# Patient Record
Sex: Male | Born: 1960 | Hispanic: No | Marital: Married | Smoking: Never smoker
Health system: Southern US, Community
[De-identification: ages and names within clinical notes are randomized; demographics above are authoritative.]

## PROBLEM LIST (undated history)

## (undated) DIAGNOSIS — E785 Hyperlipidemia, unspecified: Secondary | ICD-10-CM

## (undated) DIAGNOSIS — T7840XA Allergy, unspecified, initial encounter: Secondary | ICD-10-CM

## (undated) DIAGNOSIS — R0789 Other chest pain: Secondary | ICD-10-CM

## (undated) HISTORY — DX: Other chest pain: R07.89

## (undated) HISTORY — DX: Hyperlipidemia, unspecified: E78.5

## (undated) HISTORY — DX: Allergy, unspecified, initial encounter: T78.40XA

## (undated) HISTORY — PX: OTHER SURGICAL HISTORY: SHX169

---

## 2004-01-23 ENCOUNTER — Ambulatory Visit: Payer: Self-pay | Admitting: Family Medicine

## 2004-04-05 ENCOUNTER — Ambulatory Visit: Payer: Self-pay | Admitting: Family Medicine

## 2004-05-05 ENCOUNTER — Ambulatory Visit: Payer: Self-pay | Admitting: Internal Medicine

## 2004-05-06 ENCOUNTER — Ambulatory Visit: Payer: Self-pay | Admitting: Internal Medicine

## 2004-05-14 ENCOUNTER — Ambulatory Visit: Payer: Self-pay

## 2004-10-19 ENCOUNTER — Ambulatory Visit: Payer: Self-pay | Admitting: Internal Medicine

## 2005-01-05 ENCOUNTER — Ambulatory Visit: Payer: Self-pay | Admitting: Internal Medicine

## 2005-04-18 ENCOUNTER — Ambulatory Visit: Payer: Self-pay | Admitting: Internal Medicine

## 2005-10-18 ENCOUNTER — Encounter (INDEPENDENT_AMBULATORY_CARE_PROVIDER_SITE_OTHER): Payer: Self-pay | Admitting: *Deleted

## 2005-10-18 ENCOUNTER — Ambulatory Visit: Payer: Self-pay | Admitting: Internal Medicine

## 2005-10-18 LAB — CONVERTED CEMR LAB: PSA: 1.84 ng/mL

## 2006-02-08 ENCOUNTER — Ambulatory Visit: Payer: Self-pay | Admitting: Internal Medicine

## 2006-04-12 ENCOUNTER — Ambulatory Visit: Payer: Self-pay | Admitting: Internal Medicine

## 2006-12-20 ENCOUNTER — Ambulatory Visit: Payer: Self-pay | Admitting: Family Medicine

## 2007-04-18 ENCOUNTER — Encounter (INDEPENDENT_AMBULATORY_CARE_PROVIDER_SITE_OTHER): Payer: Self-pay | Admitting: *Deleted

## 2007-04-18 DIAGNOSIS — M1990 Unspecified osteoarthritis, unspecified site: Secondary | ICD-10-CM | POA: Insufficient documentation

## 2007-04-18 DIAGNOSIS — E785 Hyperlipidemia, unspecified: Secondary | ICD-10-CM | POA: Insufficient documentation

## 2007-04-18 DIAGNOSIS — E786 Lipoprotein deficiency: Secondary | ICD-10-CM | POA: Insufficient documentation

## 2007-04-18 DIAGNOSIS — J301 Allergic rhinitis due to pollen: Secondary | ICD-10-CM

## 2007-11-29 ENCOUNTER — Ambulatory Visit: Payer: Self-pay | Admitting: Internal Medicine

## 2007-11-29 DIAGNOSIS — J309 Allergic rhinitis, unspecified: Secondary | ICD-10-CM | POA: Insufficient documentation

## 2007-12-14 ENCOUNTER — Encounter (INDEPENDENT_AMBULATORY_CARE_PROVIDER_SITE_OTHER): Payer: Self-pay | Admitting: *Deleted

## 2008-08-07 ENCOUNTER — Ambulatory Visit: Payer: Self-pay | Admitting: Internal Medicine

## 2008-08-07 LAB — CONVERTED CEMR LAB
Nitrite: NEGATIVE
Protein, U semiquant: NEGATIVE
Urobilinogen, UA: 0.2
WBC Urine, dipstick: NEGATIVE

## 2008-11-18 ENCOUNTER — Ambulatory Visit: Payer: Self-pay | Admitting: Internal Medicine

## 2008-11-28 ENCOUNTER — Ambulatory Visit: Payer: Self-pay | Admitting: Internal Medicine

## 2008-11-28 DIAGNOSIS — N400 Enlarged prostate without lower urinary tract symptoms: Secondary | ICD-10-CM

## 2010-04-04 LAB — CONVERTED CEMR LAB
AST: 20 units/L (ref 0–37)
Albumin: 4.2 g/dL (ref 3.5–5.2)
Alkaline Phosphatase: 37 units/L — ABNORMAL LOW (ref 39–117)
BUN: 10 mg/dL (ref 6–23)
Blood in Urine, dipstick: NEGATIVE
Chloride: 104 meq/L (ref 96–112)
Eosinophils Relative: 3.9 % (ref 0.0–5.0)
GFR calc Af Amer: 133 mL/min
Glucose, Bld: 74 mg/dL (ref 70–99)
HCT: 44.5 % (ref 39.0–52.0)
HDL: 34.2 mg/dL — ABNORMAL LOW (ref >39.0)
Monocytes Absolute: 0.6 10*3/uL (ref 0.1–1.0)
Monocytes Relative: 9 % (ref 3.0–12.0)
Nitrite: NEGATIVE
Platelets: 231 10*3/uL (ref 150–400)
Potassium: 3.8 meq/L (ref 3.5–5.1)
RBC: 4.99 M/uL (ref 4.22–5.81)
Specific Gravity, Urine: 1.02
Total CHOL/HDL Ratio: 4.8
Total Protein: 8.1 g/dL (ref 6.0–8.3)
WBC Urine, dipstick: NEGATIVE
WBC: 7.2 10*3/uL (ref 4.5–10.5)

## 2010-05-01 ENCOUNTER — Encounter: Payer: Self-pay | Admitting: Internal Medicine

## 2010-06-14 ENCOUNTER — Ambulatory Visit (INDEPENDENT_AMBULATORY_CARE_PROVIDER_SITE_OTHER): Payer: 59 | Admitting: Internal Medicine

## 2010-06-14 ENCOUNTER — Encounter: Payer: Self-pay | Admitting: Internal Medicine

## 2010-06-14 VITALS — BP 120/84 | HR 74 | Ht 69.75 in | Wt 153.0 lb

## 2010-06-14 DIAGNOSIS — R6882 Decreased libido: Secondary | ICD-10-CM

## 2010-06-14 DIAGNOSIS — E785 Hyperlipidemia, unspecified: Secondary | ICD-10-CM

## 2010-06-14 DIAGNOSIS — Z0000 Encounter for general adult medical examination without abnormal findings: Secondary | ICD-10-CM

## 2010-06-14 DIAGNOSIS — N400 Enlarged prostate without lower urinary tract symptoms: Secondary | ICD-10-CM

## 2010-06-14 DIAGNOSIS — N529 Male erectile dysfunction, unspecified: Secondary | ICD-10-CM

## 2010-06-14 DIAGNOSIS — J309 Allergic rhinitis, unspecified: Secondary | ICD-10-CM

## 2010-06-14 MED ORDER — FLUTICASONE PROPIONATE 50 MCG/ACT NA SUSP: 1.0000 | Freq: Two times a day (BID) | NASAL | Status: AC | PRN

## 2010-06-14 MED ORDER — TADALAFIL 5 MG PO TABS: 5.0000 mg | ORAL_TABLET | Freq: Every day | ORAL | Status: DC

## 2010-06-14 NOTE — Progress Notes (Signed)
  Subjective:    Patient ID: Stephen Webb, male    DOB: 08-23-1960, 50 y.o.   MRN: [id number]  HPI He is here for a physical; he is asymptomatic except for some ED & decreased libido. He exercises 20 min 3X/ week as CVE & strength  w/o symptoms . He never filled Pravastatin as he was concerned about possible adverse effects.   Review of Systems Patient reports no  vision/ hearing changes,anorexia, weight change, fever ,adenopathy, persistant / recurrent hoarseness, swallowing issues, chest pain,palpitations, edema,persistant / recurrent cough, hemoptysis, dyspnea(rest, exertional, paroxysmal nocturnal), gastrointestinal  bleeding (melena, rectal bleeding), abdominal pain, excessive heart burn, GU symptoms( dysuria, hematuria, pyuria, incontinence  issues) syncope, focal weakness, memory loss,numbness & tingling, skin/hair/nail changes,depression, anxiety, abnormal bruising/bleeding, musculoskeletal symptoms/signs. He questions intermittent incomplete voiding.     Objective:   Physical Exam  Gen.: Thin but healthy and well-nourished in appearance. Alert, appropriate and cooperative throughout exam. Head: Normocephalic without obvious abnormalities;no alopecia. Eyes: No corneal or conjunctival inflammation noted. Pupils equal round reactive to light and accommodation. Fundal exam is benign without hemorrhages, exudate, papilledema. Extraocular motion intact. Vision grossly normal. Ears: External  ear exam reveals no significant lesions or deformities. Canals  Clear &.TMs normal. Hearing is grossly normal bilaterally. Nose: External nasal exam reveals no deformity or inflammation. Nasal mucosa are pink and moist but edema R nare  w/o exudates noted. Septum  normal.  Mouth: Oral mucosa and oropharynx reveal no lesions or exudates. Teeth in good repair. Neck: No deformities, masses, or tenderness noted. Range of motion normal. Thyroid normal. Lungs: Normal respiratory effort; chest expands  symmetrically. Lungs are clear to auscultation without rales, wheezes, or increased work of breathing. Heart: Normal rate and rhythm. Normal S1 and S2. No gallop, click, or rub. No  murmur. Abdomen: Bowel sounds normal; abdomen soft and nontender. No masses, organomegaly or hernias noted. Genitalia: Scrotal and penile  exam normal . DRE  Soft prostate , ULN w/o nodules . Musculoskeletal/extremities: No deformity or scoliosis noted of  the thoracic or lumbar spine. No clubbing, cyanosis, edema, or deformity noted. Range of motion normal .Tone & strength  Normal..Joints  w/o deformity.. Nail health  good. Vascular: Carotid, radial artery, dorsalis pedis and dorsalis posterior tibial pulses are full and equal. No bruits present. Neurologic: Alert and oriented x3. Deep tendon reflexes symmetrical and normal.  Skin: Intact without suspicious lesions or rashes. Lymph: No cervical, axillary, or inguinal lymphadenopathy present. Psych: Mood and affect are normal; no evidence of anxiety or depression.            Assessment & Plan:  #1 Wellness exam   #2 ? ED   #3 decreased libido #4 extrinsic allergies  #5 dyslipidemia treated with TLC  Plan:#1 Trial of low dose daily Cialis  #2 generic Flonase

## 2010-06-14 NOTE — Patient Instructions (Signed)
Please schedule fasting am labs: [facility]), hepatic panel, CBC & dif , BMET, TSH, PSA, Testosterone level (total). Codes : V70.0, 272.4,600.00,607.84.                                                              Preventive Health Care: Exercise at least 30-45 minutes a day,  3-4 days a week.  Eat a low-fat diet with lots of fruits and vegetables, up to 7-9 servings per day. Avoid obesity; your goal is waist measurement < 40 inches.Consume less than 40 grams of sugar per day from foods & drinks with High Fructose Corn Sugar as #2,3 or # 4 on label. Eye Doctor - have an eye exam @ least annually.                                                                                 Health Care Power of Attorney & Living Will. Complete if not in place ; these place you in charge of your health care decisions.

## 2010-06-15 ENCOUNTER — Other Ambulatory Visit (INDEPENDENT_AMBULATORY_CARE_PROVIDER_SITE_OTHER): Payer: 59

## 2010-06-15 DIAGNOSIS — N529 Male erectile dysfunction, unspecified: Secondary | ICD-10-CM

## 2010-06-15 DIAGNOSIS — Z0000 Encounter for general adult medical examination without abnormal findings: Secondary | ICD-10-CM

## 2010-06-15 DIAGNOSIS — E785 Hyperlipidemia, unspecified: Secondary | ICD-10-CM

## 2010-06-15 DIAGNOSIS — N400 Enlarged prostate without lower urinary tract symptoms: Secondary | ICD-10-CM

## 2010-06-15 LAB — HEPATIC FUNCTION PANEL
ALT: 17 U/L (ref 0–53)
Albumin: 4.1 g/dL (ref 3.5–5.2)
Alkaline Phosphatase: 45 U/L (ref 39–117)
Bilirubin, Direct: 0.1 mg/dL (ref 0.0–0.3)
Total Protein: 7.5 g/dL (ref 6.0–8.3)

## 2010-06-15 LAB — CBC WITH DIFFERENTIAL/PLATELET
Basophils Absolute: 0 10*3/uL (ref 0.0–0.1)
Eosinophils Absolute: 0.3 10*3/uL (ref 0.0–0.7)
Hemoglobin: 15.4 g/dL (ref 13.0–17.0)
Lymphocytes Relative: 41.9 % (ref 12.0–46.0)
MCHC: 34.6 g/dL (ref 30.0–36.0)
Monocytes Relative: 9.3 % (ref 3.0–12.0)
Neutro Abs: 1.6 10*3/uL (ref 1.4–7.7)
Neutrophils Relative %: 41 % — ABNORMAL LOW (ref 43.0–77.0)
Platelets: 217 10*3/uL (ref 150.0–400.0)
RDW: 12.8 % (ref 11.5–14.6)

## 2010-06-15 LAB — BASIC METABOLIC PANEL
CO2: 29 mEq/L (ref 19–32)
Chloride: 104 mEq/L (ref 96–112)
Creatinine, Ser: 1 mg/dL (ref 0.4–1.5)
Potassium: 4.4 mEq/L (ref 3.5–5.1)
Sodium: 140 mEq/L (ref 135–145)

## 2010-06-15 LAB — TESTOSTERONE: Testosterone: 651.88 ng/dL (ref 350.00–890.00)

## 2010-06-15 LAB — TSH: TSH: 1.35 u[IU]/mL (ref 0.35–5.50)

## 2010-06-15 LAB — PSA: PSA: 2.11 ng/mL (ref 0.10–4.00)

## 2010-07-01 ENCOUNTER — Encounter: Payer: Self-pay | Admitting: Internal Medicine

## 2010-11-10 ENCOUNTER — Ambulatory Visit (INDEPENDENT_AMBULATORY_CARE_PROVIDER_SITE_OTHER): Payer: 59 | Admitting: Internal Medicine

## 2010-11-10 ENCOUNTER — Encounter: Payer: Self-pay | Admitting: Internal Medicine

## 2010-11-10 ENCOUNTER — Ambulatory Visit (HOSPITAL_BASED_OUTPATIENT_CLINIC_OR_DEPARTMENT_OTHER)
Admission: RE | Admit: 2010-11-10 | Discharge: 2010-11-10 | Disposition: A | Discharge status: Home / Self Care | Payer: 59 | Source: Ambulatory Visit | Attending: Internal Medicine | Admitting: Internal Medicine

## 2010-11-10 DIAGNOSIS — R7611 Nonspecific reaction to tuberculin skin test without active tuberculosis: Secondary | ICD-10-CM

## 2010-11-10 DIAGNOSIS — E785 Hyperlipidemia, unspecified: Secondary | ICD-10-CM

## 2010-11-10 NOTE — Patient Instructions (Signed)
Order for x-rays entered into  the computer; these will be performed at  [facility]. No appointment is necessary.

## 2010-11-10 NOTE — Progress Notes (Signed)
  Subjective:    Patient ID: Stephen Webb, male    DOB: 1960-12-27, 50 y.o.   MRN: [id number]  HPIThe [place]  consulate has requested clearance for travel to [place]; proof of absence of communicable disease has been requested.  He had recent weight loss but this was related to fasting during Ramadan  He specifically denies fever, chills, sweats, chest pain, palpitations, cough, hemoptysis or shortness of breath . He also denies abdominal pain, diarrhea, tremor, dysuria, pyuria, hematuria, or new rash or lesions.  Past history includes positive PPD. He is unsure as to whether he has ever received BCG immunization, which might cause a false positive PPD. Last chest x-ray was approximately 13 years ago.     Review of Systems     Objective:   Physical Exam  Gen.: Thin but well-nourished; in no acute distress Eyes: Extraocular motion intact; no lid lag or proptosis Heart: Normal rhythm and rate without significant murmur, gallop, or extra heart sounds Lungs: Chest clear to auscultation without rales,rales, wheezes Abdomen: bowel sounds normal, soft and non-tender without masses, organomegaly or hernias noted.  No guarding or rebound  Neuro:Deep tendon reflexes are equal and within normal limits; no tremor  Skin: Warm and dry without significant lesions or rashes; no onycholysis Lymphatic: No lymphadenopathy is noted about the head, neck, axilla Psych: Normally communicative and interactive; no abnormal mood or affect clinically.         Assessment & Plan:   #1 no evidence of communicable disease  #2 past history of positive PPD  Plan: Chest x-ray will be repeated; this would prevent any delay in travel if  it reveals no active disease.

## 2010-11-16 ENCOUNTER — Telehealth: Payer: Self-pay | Admitting: Internal Medicine

## 2010-11-16 NOTE — Telephone Encounter (Signed)
Spoke with patient, patient requested copy of Chest Xray (already mailed), patient also request a copy to be faxed to [phone] (faxed) . Xray and OV note indicated patient with no communicable disease

## 2010-11-25 ENCOUNTER — Ambulatory Visit (INDEPENDENT_AMBULATORY_CARE_PROVIDER_SITE_OTHER): Payer: 59 | Admitting: *Deleted

## 2010-11-25 VITALS — Temp 98.0°F

## 2010-11-25 DIAGNOSIS — Z0000 Encounter for general adult medical examination without abnormal findings: Secondary | ICD-10-CM

## 2011-05-09 ENCOUNTER — Encounter: Payer: Self-pay | Admitting: Family Medicine

## 2011-05-09 ENCOUNTER — Ambulatory Visit (INDEPENDENT_AMBULATORY_CARE_PROVIDER_SITE_OTHER): Payer: 59 | Admitting: Family Medicine

## 2011-05-09 DIAGNOSIS — H109 Unspecified conjunctivitis: Secondary | ICD-10-CM

## 2011-05-09 DIAGNOSIS — J301 Allergic rhinitis due to pollen: Secondary | ICD-10-CM

## 2011-05-09 DIAGNOSIS — J019 Acute sinusitis, unspecified: Secondary | ICD-10-CM

## 2011-05-09 MED ORDER — AMOXICILLIN 875 MG PO TABS: 875.0000 mg | ORAL_TABLET | Freq: Two times a day (BID) | ORAL | Status: AC

## 2011-05-09 NOTE — Progress Notes (Signed)
  Subjective:    Patient ID: Stephen Webb, male    DOB: [birth year], 51 y.o.   MRN: [id number]  HPI URI- sxs started last week but worsened over the weekend.  + nasal congestion, PND, sore throat.  + facial pain yesterday.  Denies cough.  Woke up this AM w/ red eyes, sticky drainage from both eyes.  No itching of eyes, no pain, no visual changes.  No fevers.  + sick contacts.  Hx of seasonal allergies- taking OTC antihistamine but not consistently.   Review of Systems For ROS see HPI     Objective:   Physical Exam  Constitutional: He appears well-developed and well-nourished. No distress.  HENT:  Head: Normocephalic and atraumatic.  Right Ear: Tympanic membrane normal.  Left Ear: Tympanic membrane normal.  Nose: Mucosal edema and rhinorrhea present. Right sinus exhibits maxillary sinus tenderness and frontal sinus tenderness. Left sinus exhibits maxillary sinus tenderness and frontal sinus tenderness.  Mouth/Throat: Mucous membranes are normal. Oropharyngeal exudate and posterior oropharyngeal erythema present. No posterior oropharyngeal edema.       + PND  Eyes: EOM are normal. Pupils are equal, round, and reactive to light.       Conjunctival erythema/injxn- no drainage.  Neck: Normal range of motion. Neck supple.  Cardiovascular: Normal rate, regular rhythm and normal heart sounds.   Pulmonary/Chest: Effort normal and breath sounds normal. No respiratory distress. He has no wheezes.       + hacking cough  Lymphadenopathy:    He has no cervical adenopathy.  Skin: Skin is warm and dry.          Assessment & Plan:

## 2011-05-09 NOTE — Patient Instructions (Signed)
This is an early sinus infection Start the Amoxicillin twice daily (this will also treat your eyes) Drink plenty of fluids Take the Claritin or Zyrtec daily to improve nasal congestion, post nasal drip, and eye irritation Call with any questions or concerns Hang in there!

## 2011-05-10 DIAGNOSIS — H109 Unspecified conjunctivitis: Secondary | ICD-10-CM | POA: Insufficient documentation

## 2011-05-10 NOTE — Assessment & Plan Note (Signed)
New.  Pt's sxs more consistent w/ allergic conjunctivitis but if there is a component of bacterial infxn it will be tx'd w/ the systemic abx for his sinusitis.  Reviewed supportive care and red flags that should prompt return.  Pt expressed understanding and is in agreement w/ plan.

## 2011-05-10 NOTE — Assessment & Plan Note (Signed)
Deteriorated.  Encouraged pt to start oral antihistamine daily rather than sporadically.  Pt in agreement.

## 2011-05-10 NOTE — Assessment & Plan Note (Signed)
New.  Pt's sxs and PE consistent w/ infxn.  Start abx.  Reviewed supportive care and red flags that should prompt return.  Pt expressed understanding and is in agreement w/ plan.  

## 2011-07-12 ENCOUNTER — Other Ambulatory Visit: Payer: Self-pay | Admitting: Internal Medicine

## 2011-07-12 NOTE — Telephone Encounter (Signed)
Patient needs to schedule a CPX  

## 2011-11-14 ENCOUNTER — Encounter: Payer: Self-pay | Admitting: Internal Medicine

## 2011-11-14 ENCOUNTER — Ambulatory Visit (INDEPENDENT_AMBULATORY_CARE_PROVIDER_SITE_OTHER): Payer: 59 | Admitting: Internal Medicine

## 2011-11-14 VITALS — BP 114/78 | HR 77 | Temp 98.3°F | Resp 12 | Ht 71.5 in | Wt 147.2 lb

## 2011-11-14 DIAGNOSIS — Z0000 Encounter for general adult medical examination without abnormal findings: Secondary | ICD-10-CM

## 2011-11-14 DIAGNOSIS — E785 Hyperlipidemia, unspecified: Secondary | ICD-10-CM

## 2011-11-14 MED ORDER — TADALAFIL 5 MG PO TABS: 5.0000 mg | ORAL_TABLET | Freq: Every day | ORAL | Status: DC

## 2011-11-14 NOTE — Addendum Note (Signed)
Addended by: Maurice Small on: 11/14/2011 02:33 PM   Modules accepted: Orders

## 2011-11-14 NOTE — Progress Notes (Signed)
  Subjective:    Patient ID: Stephen Webb, male    DOB: October 12, 1960, 51 y.o.   MRN: [id number]  HPI  Stephen Webb  is here for a physical; he denies acute issues.     Review of Systems He exercises as walking for 15-20 minutes every other day without symptoms. He specifically denies chest pain, palpitations, claudication, dyspnea, or edema. He is on low sugar diet. He elected not to take a statin as he was concerned about the potential side effects. The 2007 advanced cholesterol test was reviewed; LDL goal is less than 100. His seasonal allergies have been much better this year; these are typically in the Spring and to lesser extent in the Fall. He is 3; he has not had a colonoscopy. Standard of care was reviewed. His weight has been essentially stable even while fasting during Ramadan. He denies abdominal pain, melena, rectal bleeding, or unexplained weight loss.     Objective:   Physical Exam Gen.: Thin but healthy and well-nourished in appearance. Alert, appropriate and cooperative throughout exam. Head: Normocephalic without obvious abnormalities; no alopecia. Goatee Eyes: No corneal or conjunctival inflammation noted. Pupils equal round reactive to light and accommodation. Fundal exam is benign without hemorrhages, exudate, papilledema. Extraocular motion intact. Vision grossly normal with lenses. Ears: External  ear exam reveals no significant lesions or deformities. Wax on R; LTM normal. Hearing is grossly normal bilaterally. Nose: External nasal exam reveals no deformity or inflammation. Nasal mucosa are pink and moist. No lesions or exudates noted.   Mouth: Oral mucosa and oropharynx reveal no lesions or exudates. Teeth in good repair. Neck: No deformities, masses, or tenderness noted. Range of motion & Thyroid normal Lungs: Normal respiratory effort; chest expands symmetrically. Lungs are clear to auscultation without rales, wheezes, or increased work of breathing. Heart: Normal rate  and rhythm. Normal S1 and S2. No gallop, click, or rub. S4 w/o murmur. Abdomen: Bowel sounds normal; abdomen soft and nontender. No masses, organomegaly or hernias noted. Genitalia/DRE:Genitalia normal except for left varices . Prostate is enlarged 1.5-2.0+ without nodularity or induration. Musculoskeletal/extremities: No deformity or scoliosis noted of  the thoracic or lumbar spine. No clubbing, cyanosis, edema, or deformity noted. Range of motion  normal .Tone & strength  normal.Joints normal. Nail health  good. Vascular: Carotid, radial artery, dorsalis pedis and  posterior tibial pulses are full and equal. No bruits present. Neurologic: Alert and oriented x3. Deep tendon reflexes symmetrical and normal.         Skin: Intact without suspicious lesions or rashes. Lymph: No cervical, axillary, or inguinal lymphadenopathy present. Psych: Mood and affect are normal. Normally interactive                                                                                         Assessment & Plan:  #1 comprehensive physical exam; no acute findings #2 see Problem List with Assessments & Recommendations Plan: see Orders

## 2011-11-14 NOTE — Patient Instructions (Addendum)
Preventive Health Care: Exercise at least 30-45 minutes a day,  3-4 days a week.  Eat a low-fat diet with lots of fruits and vegetables, up to 7-9 servings per day.  Consume less than 40 grams of sugar per day from foods & drinks with High Fructose Corn Sugar as # 1,2,3 or # 4 on label. Health Care Power of Attorney & Living Will. Complete if not in place ; these place you in charge of your health care decisions. As per the Standard of Care , screening Colonoscopy recommended @ 50 & every 5-10 years thereafter . More frequent monitor would be dictated by family history or findings @ Colonoscopy  Please  schedule fasting Labs : BMET,Lipids, hepatic panel, CBC & dif, TSH, PSA. PLEASE BRING THESE INSTRUCTIONS TO FOLLOW UP  LAB APPOINTMENT.This will guarantee correct labs are drawn, eliminating need for repeat blood sampling ( needle sticks ! ). Diagnoses /Codes: V70.0.  If you activate My Chart; the results can be released to you as soon as they populate from the lab. If you choose not to use this program; the labs have to be reviewed, copied & mailed   causing a delay in getting the results to you.

## 2011-11-16 ENCOUNTER — Other Ambulatory Visit: Payer: 59

## 2011-11-17 ENCOUNTER — Other Ambulatory Visit (INDEPENDENT_AMBULATORY_CARE_PROVIDER_SITE_OTHER): Payer: 59

## 2011-11-17 DIAGNOSIS — Z0000 Encounter for general adult medical examination without abnormal findings: Secondary | ICD-10-CM

## 2011-11-17 LAB — BASIC METABOLIC PANEL
BUN: 12 mg/dL (ref 6–23)
CO2: 24 mEq/L (ref 19–32)
Calcium: 9.4 mg/dL (ref 8.4–10.5)
Glucose, Bld: 87 mg/dL (ref 70–99)
Sodium: 141 mEq/L (ref 135–145)

## 2011-11-17 LAB — CBC WITH DIFFERENTIAL/PLATELET
Basophils Relative: 0.8 % (ref 0.0–3.0)
Eosinophils Absolute: 0.2 10*3/uL (ref 0.0–0.7)
Eosinophils Relative: 4 % (ref 0.0–5.0)
HCT: 41.5 % (ref 39.0–52.0)
Lymphs Abs: 2.6 10*3/uL (ref 0.7–4.0)
MCHC: 33.3 g/dL (ref 30.0–36.0)
MCV: 90 fl (ref 78.0–100.0)
Monocytes Absolute: 0.4 10*3/uL (ref 0.1–1.0)
Neutrophils Relative %: 29.4 % — ABNORMAL LOW (ref 43.0–77.0)
Platelets: 189 10*3/uL (ref 150.0–400.0)

## 2011-11-17 LAB — HEPATIC FUNCTION PANEL
Bilirubin, Direct: 0.1 mg/dL (ref 0.0–0.3)
Total Bilirubin: 0.6 mg/dL (ref 0.3–1.2)

## 2011-11-17 LAB — LIPID PANEL
HDL: 44.9 mg/dL (ref >39.00)
Total CHOL/HDL Ratio: 4
VLDL: 16.8 mg/dL (ref 0.0–40.0)

## 2011-11-17 LAB — PSA: PSA: 1.31 ng/mL (ref 0.10–4.00)

## 2011-11-17 NOTE — Progress Notes (Signed)
Labs only

## 2012-05-02 ENCOUNTER — Ambulatory Visit (INDEPENDENT_AMBULATORY_CARE_PROVIDER_SITE_OTHER): Payer: 59 | Admitting: Internal Medicine

## 2012-05-02 ENCOUNTER — Encounter: Payer: Self-pay | Admitting: Internal Medicine

## 2012-05-02 VITALS — BP 120/84 | HR 78 | Temp 97.9°F | Wt 149.0 lb

## 2012-05-02 DIAGNOSIS — H019 Unspecified inflammation of eyelid: Secondary | ICD-10-CM

## 2012-05-02 MED ORDER — GENTAMICIN SULFATE 0.3 % OP SOLN: 2.0000 [drp] | OPHTHALMIC | Status: DC

## 2012-05-02 NOTE — Assessment & Plan Note (Signed)
Infected eye lid gland, will prescribe gentamicin, will refer to ophthalmology within 48 hours. See instructions

## 2012-05-02 NOTE — Patient Instructions (Addendum)
Use eyedrops as recommended. Put a warm compress over the right eye 3 times a day. Go to the ER if you have any swelling in the face, decreased vision.

## 2012-05-02 NOTE — Progress Notes (Signed)
  Subjective:    Patient ID: Stephen Webb, male    DOB: Feb 14, 1961, 52 y.o.   MRN: [id number]  HPI Acute visit Noted a lump in the right eye last week ago, getting slightly bigger. Has a small amount of dry discharge in the corner of the right eye for the last few days.  Past Medical History  Diagnosis Date  . Atypical chest pain     Stress Cardiolite Negative 2006   . Allergy     seasonal, especially in Spring  . Hyperlipidemia     Pravastatin never taken ; he was concerned about  adverse effects   Past Surgical History  Procedure Laterality Date  . None    . No colonoscopy      SOC discussed      Review of Systems Denies any actual pain or blurred vision but his eyes are itching.    Objective:   Physical Exam  Constitutional: He appears well-developed and well-nourished. No distress.  Eyes: EOM are normal. Pupils are equal, round, and reactive to light. Right eye exhibits no discharge. Left eye exhibits no discharge. No scleral icterus.    Skin: He is not diaphoretic.       Assessment & Plan:

## 2012-09-03 HISTORY — PX: RETINAL LASER PROCEDURE: SHX2339

## 2012-10-24 ENCOUNTER — Ambulatory Visit (INDEPENDENT_AMBULATORY_CARE_PROVIDER_SITE_OTHER): Payer: 59 | Admitting: Internal Medicine

## 2012-10-24 ENCOUNTER — Encounter: Payer: Self-pay | Admitting: Internal Medicine

## 2012-10-24 VITALS — BP 118/80 | HR 77 | Temp 98.5°F | Wt 144.8 lb

## 2012-10-24 DIAGNOSIS — J209 Acute bronchitis, unspecified: Secondary | ICD-10-CM

## 2012-10-24 MED ORDER — AZITHROMYCIN 250 MG PO TABS: ORAL_TABLET | ORAL | Status: DC

## 2012-10-24 NOTE — Patient Instructions (Addendum)
Plain Mucinex (NOT D) for thick secretions ;force NON dairy fluids .  Mucinex DM as needed for cough. Nasal cleansing in the shower as discussed with lather of mild shampoo.After 10 seconds wash off lather while  exhaling through nostrils. Make sure that all residual soap is removed to prevent irritation.  Use a Neti pot daily only  as needed for significant sinus congestion; going from open side to congested side . Plain Allegra (NOT D )  160 daily , Loratidine 10 mg , OR Zyrtec 10 mg @ bedtime  as needed for itchy eyes & sneezing.

## 2012-10-24 NOTE — Progress Notes (Signed)
  Subjective:    Patient ID: Stephen Webb, male    DOB: [birth year], 52 y.o.   MRN: [id number]  HPI    2 weeks ago he had a sore throat and was treated with antibiotics apparently ( ? Amoxicillin 200 mg) while in [place].  On 10/21/12 he began to have nasal congestion and rhinitis with sneezing. Subsequently developed chest congestion with dark yellow- green  sputum. There is no associated shortness of breath or wheezing.  He describes minor  frontal sinus discomfort as well as some nasal purulence.  He also describes some arthralgias and shoulder area    Review of Systems  He denies fever, chills, or sweats. He has no significant extrinsic symptoms of itchy, watery eyes.  He is not having facial pain, dental pain, otic pain, or otic discharge.     Objective:   Physical Exam General appearance: thin but in good health ;well nourished; no acute distress or increased work of breathing is present.  No  lymphadenopathy about the head, neck, or axilla noted.   Eyes: No conjunctival inflammation or lid edema is present.   Ears:  External ear exam shows no significant lesions or deformities.  Otoscopic examination reveals clear canals, tympanic membranes are intact bilaterally without bulging, retraction, inflammation or discharge.  Nose:  External nasal examination shows no deformity or inflammation. Nasal mucosa are pink and moist without lesions or exudates. No septal dislocation or deviation.No obstruction to airflow.   Oral exam: Dental hygiene is good; lips and gums are healthy appearing.There is minimal oropharyngeal erythema ; no exudate noted.   Neck:  No deformities, masses, or tenderness noted.   Supple with full range of motion without pain.   Heart:  Normal rate and regular rhythm. S1 and S2 normal without gallop, murmur, click, rub or other extra sounds.   Lungs:Chest clear to auscultation; no wheezes, rhonchi,rales ,or rubs present.No increased work of breathing.     Extremities:  No cyanosis, edema, or clubbing  noted    Skin: Warm & dry .         Assessment & Plan:  #1 acute bronchitis w/o bronchospasm #2 URI symptoms w/o sinusitis Plan: See orders and recommendations

## 2013-01-07 ENCOUNTER — Telehealth: Payer: Self-pay

## 2013-01-07 NOTE — Telephone Encounter (Addendum)
Left message for call back Non identifiable Medication List and allergies: reviewed and updated  90 day supply/mail order: na Local prescriptions: [facility] [address] [place]  Immunizations due: declines flu vaccine  A/P:   No changes to FH or PSH Never had CCS PSA--11/2011--WNL  To Discuss with Provider: Not at this time

## 2013-01-08 ENCOUNTER — Ambulatory Visit (INDEPENDENT_AMBULATORY_CARE_PROVIDER_SITE_OTHER): Payer: 59 | Admitting: Internal Medicine

## 2013-01-08 ENCOUNTER — Encounter: Payer: Self-pay | Admitting: Internal Medicine

## 2013-01-08 VITALS — BP 125/82 | HR 76 | Temp 98.2°F

## 2013-01-08 DIAGNOSIS — Z0000 Encounter for general adult medical examination without abnormal findings: Secondary | ICD-10-CM

## 2013-01-08 DIAGNOSIS — E785 Hyperlipidemia, unspecified: Secondary | ICD-10-CM

## 2013-01-08 DIAGNOSIS — N400 Enlarged prostate without lower urinary tract symptoms: Secondary | ICD-10-CM

## 2013-01-08 MED ORDER — TADALAFIL 5 MG PO TABS: 5.0000 mg | ORAL_TABLET | Freq: Every day | ORAL | Status: AC

## 2013-01-08 NOTE — Patient Instructions (Addendum)
Your next office appointment will be determined based upon review of your pending labs . Those instructions will be transmitted to you through My Chart   As per the Standard of Care , screening Colonoscopy recommended @ 50 & every 5-10 years thereafter . More frequent monitor would be dictated by family history or findings @ Colonoscopy 

## 2013-01-08 NOTE — Progress Notes (Signed)
  Subjective:    Patient ID: Stephen Webb, male    DOB: 02-May-1960, 52 y.o.   MRN: [id number]  HPI  He is here for a physical;acute issues denied     Review of Systems A heart healthy diet is followed; exercise encompasses 30-40 minutes > 3  times per week as  Aerobics or walking without symptoms.  Family history is negative for premature coronary disease. Advanced cholesterol testing reveals  LDL goal is less than 100 . Statin never taken.  Low dose ASA not taken Specifically denied are chest pain, palpitations, dyspnea, or claudication.  He denies abdominal pain, unexplained weight loss, melena, rectal bleeding. No colonoscopy to date; standard of care discussed.      Objective:   Physical Exam Gen.: Thin but healthy and well-nourished in appearance. Alert, appropriate and cooperative throughout exam.Appears younger than stated age  Head: Normocephalic without obvious abnormalities;  no alopecia  Eyes: No corneal or conjunctival inflammation noted. Pupils equal round reactive to light and accommodation. Extraocular motion intact. Fundal exam is benign without hemorrhages, exudate, papilledema.  Vision grossly normal with lenses Ears: External  ear exam reveals no significant lesions or deformities. Canals clear .TMs normal. Hearing is grossly normal bilaterally. Nose: External nasal exam reveals no deformity or inflammation. Nasal mucosa are pink and moist. No lesions or exudates noted.  Mouth: Oral mucosa and oropharynx reveal no lesions or exudates. Teeth in good repair. Neck: No deformities, masses, or tenderness noted. Range of motion & Thyroid normal. Lungs: Normal respiratory effort; chest expands symmetrically. Lungs are clear to auscultation without rales, wheezes, or increased work of breathing. Heart: Normal rate and rhythm. Normal S1 and S2. No gallop, click, or rub. S4 w/o murmur. Abdomen: Bowel sounds normal; abdomen soft and nontender. No masses, organomegaly or hernias  noted. Genitalia: Genitalia normal except for small left varices. Prostate is asymmetrically enlargeed w/o nodularity or induration                                    Musculoskeletal/extremities: No deformity or scoliosis noted of  the thoracic or lumbar spine.   No clubbing, cyanosis, edema, or significant extremity  deformity noted. Range of motion normal .Tone & strength normal. Hand joints normal OR reveal mild  DJD DIP changes. Fingernail health good. Able to lie down & sit up w/o help. Negative SLR bilaterally Vascular: Carotid, radial artery, dorsalis pedis and  posterior tibial pulses are full and equal. No bruits present. Neurologic: Alert and oriented x3. Deep tendon reflexes symmetrical and normal.       Skin: Intact without suspicious lesions or rashes. Lymph: No cervical, axillary, or inguinal lymphadenopathy present. Psych: Mood and affect are normal. Normally interactive                                                                                        Assessment & Plan:  #1 comprehensive physical exam; no acute findings  Plan: see Orders  & Recommendations

## 2013-01-09 ENCOUNTER — Other Ambulatory Visit (INDEPENDENT_AMBULATORY_CARE_PROVIDER_SITE_OTHER): Payer: 59

## 2013-01-09 DIAGNOSIS — Z0000 Encounter for general adult medical examination without abnormal findings: Secondary | ICD-10-CM

## 2013-01-09 LAB — CBC WITH DIFFERENTIAL/PLATELET
Basophils Absolute: 0 10*3/uL (ref 0.0–0.1)
Eosinophils Relative: 2.1 % (ref 0.0–5.0)
Hemoglobin: 14.5 g/dL (ref 13.0–17.0)
Lymphocytes Relative: 45.9 % (ref 12.0–46.0)
Lymphs Abs: 2 10*3/uL (ref 0.7–4.0)
MCV: 87.9 fl (ref 78.0–100.0)
Monocytes Absolute: 0.4 10*3/uL (ref 0.1–1.0)
Monocytes Relative: 8.8 % (ref 3.0–12.0)
Neutro Abs: 1.9 10*3/uL (ref 1.4–7.7)
RBC: 4.8 Mil/uL (ref 4.22–5.81)
RDW: 12.6 % (ref 11.5–14.6)
WBC: 4.4 10*3/uL — ABNORMAL LOW (ref 4.5–10.5)

## 2013-01-09 LAB — LIPID PANEL
Cholesterol: 196 mg/dL (ref 0–200)
LDL Cholesterol: 135 mg/dL — ABNORMAL HIGH (ref 0–99)
VLDL: 10.4 mg/dL (ref 0.0–40.0)

## 2013-01-09 LAB — BASIC METABOLIC PANEL
BUN: 15 mg/dL (ref 6–23)
Calcium: 9.3 mg/dL (ref 8.4–10.5)
GFR: 104.36 mL/min (ref >60.00)
Sodium: 138 mEq/L (ref 135–145)

## 2013-01-09 LAB — HEPATIC FUNCTION PANEL
AST: 22 U/L (ref 0–37)
Albumin: 4.2 g/dL (ref 3.5–5.2)
Alkaline Phosphatase: 39 U/L (ref 39–117)

## 2013-01-09 LAB — PSA: PSA: 1.7 ng/mL (ref 0.10–4.00)

## 2013-01-09 LAB — TSH: TSH: 1.4 u[IU]/mL (ref 0.35–5.50)

## 2013-01-10 ENCOUNTER — Encounter: Payer: Self-pay | Admitting: *Deleted

## 2013-01-10 NOTE — Progress Notes (Signed)
Letter mailed to patient.

## 2013-03-25 ENCOUNTER — Ambulatory Visit (INDEPENDENT_AMBULATORY_CARE_PROVIDER_SITE_OTHER): Payer: 59 | Admitting: Internal Medicine

## 2013-03-25 ENCOUNTER — Encounter: Payer: Self-pay | Admitting: Internal Medicine

## 2013-03-25 VITALS — BP 103/69 | HR 92 | Temp 98.3°F | Wt 150.0 lb

## 2013-03-25 DIAGNOSIS — R059 Cough, unspecified: Secondary | ICD-10-CM

## 2013-03-25 DIAGNOSIS — J111 Influenza due to unidentified influenza virus with other respiratory manifestations: Secondary | ICD-10-CM

## 2013-03-25 DIAGNOSIS — R05 Cough: Secondary | ICD-10-CM

## 2013-03-25 LAB — POCT INFLUENZA A/B
INFLUENZA A, POC: POSITIVE
INFLUENZA B, POC: POSITIVE

## 2013-03-25 MED ORDER — OSELTAMIVIR PHOSPHATE 75 MG PO CAPS: 75.0000 mg | ORAL_CAPSULE | Freq: Two times a day (BID) | ORAL | Status: DC

## 2013-03-25 NOTE — Progress Notes (Signed)
   Subjective:    Patient ID: Stephen Webb, male    DOB: 11/19/1960, 53 y.o.   MRN: [id number]  HPI   Symptoms began 03/24/13 in the evening as diffuse aching with myalgias and arthralgias.  He did have some frontal headache without associated facial pain or nasal purulence. He did exhibit rhinitis with clear discharge. He had chills and sweats with low grade fever.  He treated  symptoms with rest, fluids, nonsteroidal agents with some benefit. He was unable to work today.  He did not take the flu shot. Several coworkers were out with respiratory symptoms.    Review of Systems He also denied dental pain, otic pain, otic discharge. He is been no associated cough. Sore throat was minor      Objective:   Physical Exam General appearance:thin but in good health ;well nourished; no acute distress or increased work of breathing is present.  No  lymphadenopathy about the head, neck, or axilla noted.   Eyes: No conjunctival inflammation or lid edema is present. EOM & vision intact.  Ears:  External ear exam shows no significant lesions or deformities.  Otoscopic examination reveals clear canals, tympanic membranes are intact bilaterally without bulging, retraction, inflammation or discharge.  Nose:  External nasal examination shows no deformity or inflammation. Nasal mucosa are pink and moist without lesions or exudates. No septal dislocation or deviation.No obstruction to airflow.   Oral exam: Dental hygiene is good; lips and gums are healthy appearing.There is mild oropharyngeal erythema ; no exudate noted.   Neck:  No deformities,  masses, or tenderness noted.   Supple with full range of motion without pain.   Heart:  Normal rate and regular rhythm. S1 and S2 normal without gallop, murmur, click, rub or other extra sounds. S4  Lungs:Chest clear to auscultation; no wheezes, rhonchi,rales ,or rubs present.No increased work of breathing.    Extremities:  No cyanosis, edema, or clubbing   noted    Skin: Warm & slightly damp.         Assessment & Plan:  #1 flu syndrome See orders

## 2013-03-25 NOTE — Progress Notes (Signed)
Pre visit review using our clinic review tool, if applicable. No additional management support is needed unless otherwise documented below in the visit note. 

## 2013-03-25 NOTE — Patient Instructions (Signed)
NSAIDS ( Aleve, Advil, Naproxen) or Tylenol every 4 hrs as needed for fever as discussed based on label recommendations.Stay well hydrated. Drink to thirst up to 40 ounces of fluids daily. 

## 2014-02-17 ENCOUNTER — Encounter: Payer: Self-pay | Admitting: Internal Medicine

## 2014-02-17 ENCOUNTER — Ambulatory Visit (INDEPENDENT_AMBULATORY_CARE_PROVIDER_SITE_OTHER): Payer: 59 | Admitting: Internal Medicine

## 2014-02-17 VITALS — BP 131/83 | HR 60 | Temp 97.9°F | Ht 71.0 in | Wt 158.5 lb

## 2014-02-17 DIAGNOSIS — Z0000 Encounter for general adult medical examination without abnormal findings: Secondary | ICD-10-CM

## 2014-02-17 NOTE — Patient Instructions (Signed)
Stop by the front desk and schedule labs to be done within few days (fasting)    Please come back to the office in 1 year  for a physical exam. Come back fasting    

## 2014-02-17 NOTE — Assessment & Plan Note (Addendum)
Tetanus shot 2009 Declined a flu shot Colon cancer screening, never had a colonoscopy, declined a GI referral, benefits of a colonoscopy discussed. I FOB  provided Prostate cancer screening, DRE today negative, check a PSA. In the past he was told he may have BPH, prostate is not enlarged, he is nearly asymptomatic taking saw palmetto. He has a great lifestyle,praised  RTC 1 year

## 2014-02-17 NOTE — Progress Notes (Signed)
Pre visit review using our clinic review tool, if applicable. No additional management support is needed unless otherwise documented below in the visit note. 

## 2014-02-17 NOTE — Progress Notes (Signed)
Subjective:    Patient ID: Katha HammingMohamoud A Volkert, male    DOB: 03/09/1960, 53 y.o.   MRN: [id number]  DOS:  02/17/2014 Type of visit - description : New patient to me, transferring from Dr. Alwyn RenHopper,   chart reviewed, CPX Interval history: In general feeling well, has no problems. Has a healthy lifestyle  ROS Denies chest pain or difficulty breathing No nausea, vomiting, diarrhea or blood in the stools. No cough, sputum production or wheezing No anxiety or depression  Past Medical History  Diagnosis Date  . Atypical chest pain     Stress Cardiolite Negative 2006   . Allergy     seasonal, especially in Spring  . Hyperlipidemia     Pravastatin never taken ; he was concerned about  adverse effects    Past Surgical History  Procedure Laterality Date  . No colonoscopy      SOC discussed  . Retinal laser procedure  09-03-12    History   Social History  . Marital Status: Married    Spouse Name: N/A    Number of Children: 3  . Years of Education: N/A   Occupational History  . IT     Social History Main Topics  . Smoking status: Never Smoker   . Smokeless tobacco: Never Used  . Alcohol Use: No  . Drug Use: No  . Sexual Activity: Not on file   Other Topics Concern  . Not on file   Social History Narrative   Original from [place]     Family History  Problem Relation Age of Onset  . Benign prostatic hyperplasia Father   . Hypertension Mother   . Prostate cancer Paternal Uncle [id number]  . Stroke Neg Hx   . Heart disease Neg Hx   . Diabetes Neg Hx   . Colon cancer Neg Hx        Medication List       This list is accurate as of: 02/17/14  6:36 PM.  Always use your most recent med list.               MULTIVITAMIN PO  Take by mouth once.     saw palmetto 160 MG capsule  Take 160 mg by mouth 2 (two) times daily. Otc, dose per pt     tadalafil 5 MG tablet  Commonly known as:  CIALIS  Take 1 tablet (5 mg total) by mouth daily.           Objective:   Physical Exam BP 131/83 mmHg  Pulse 60  Temp(Src) 97.9 F (36.6 C) (Oral)  Ht 5\' 11"  (1.803 m)  Wt 158 lb 8 oz (71.895 kg)  BMI 22.12 kg/m2  SpO2 96% General -- alert, well-developed, NAD.  Neck --no thyromegaly , normal carotid pulse  HEENT-- Not pale.   Lungs -- normal respiratory effort, no intercostal retractions, no accessory muscle use, and normal breath sounds.  Heart-- normal rate, regular rhythm, no murmur.  Abdomen-- Not distended, good bowel sounds,soft, non-tender. Rectal-- No external abnormalities noted. Normal sphincter tone. No rectal masses or tenderness. Stool brown, Hemoccult negative  Prostate--Prostate gland firm and smooth, no enlargement, nodularity, tenderness, mass, asymmetry or induration. Extremities-- no pretibial edema bilaterally  Neurologic--  alert & oriented X3. Speech normal, gait appropriate for age, strength symmetric and appropriate for age.  Psych-- Cognition and judgment appear intact. Cooperative with normal attention span and concentration. No anxious or depressed appearing.    Assessment & Plan:

## 2014-02-20 ENCOUNTER — Other Ambulatory Visit (INDEPENDENT_AMBULATORY_CARE_PROVIDER_SITE_OTHER): Payer: 59

## 2014-02-20 DIAGNOSIS — Z0000 Encounter for general adult medical examination without abnormal findings: Secondary | ICD-10-CM

## 2014-02-20 LAB — COMPREHENSIVE METABOLIC PANEL
ALT: 16 U/L (ref 0–53)
AST: 22 U/L (ref 0–37)
Albumin: 4.2 g/dL (ref 3.5–5.2)
Alkaline Phosphatase: 42 U/L (ref 39–117)
BUN: 14 mg/dL (ref 6–23)
CO2: 26 mEq/L (ref 19–32)
CREATININE: 0.8 mg/dL (ref 0.4–1.5)
Calcium: 9.1 mg/dL (ref 8.4–10.5)
Chloride: 105 mEq/L (ref 96–112)
GFR: 110.09 mL/min (ref >60.00)
Glucose, Bld: 94 mg/dL (ref 70–99)
Potassium: 4.2 mEq/L (ref 3.5–5.1)
Sodium: 138 mEq/L (ref 135–145)
Total Bilirubin: 0.5 mg/dL (ref 0.2–1.2)
Total Protein: 7 g/dL (ref 6.0–8.3)

## 2014-02-20 LAB — CBC WITH DIFFERENTIAL/PLATELET
BASOS ABS: 0 10*3/uL (ref 0.0–0.1)
BASOS PCT: 0.8 % (ref 0.0–3.0)
Eosinophils Absolute: 0.2 10*3/uL (ref 0.0–0.7)
Eosinophils Relative: 3.7 % (ref 0.0–5.0)
HCT: 42.7 % (ref 39.0–52.0)
HEMOGLOBIN: 14.3 g/dL (ref 13.0–17.0)
LYMPHS ABS: 2.1 10*3/uL (ref 0.7–4.0)
LYMPHS PCT: 48.6 % — AB (ref 12.0–46.0)
MCHC: 33.5 g/dL (ref 30.0–36.0)
MCV: 88.7 fl (ref 78.0–100.0)
MONOS PCT: 8.4 % (ref 3.0–12.0)
Monocytes Absolute: 0.4 10*3/uL (ref 0.1–1.0)
NEUTROS ABS: 1.6 10*3/uL (ref 1.4–7.7)
Neutrophils Relative %: 38.5 % — ABNORMAL LOW (ref 43.0–77.0)
Platelets: 213 10*3/uL (ref 150.0–400.0)
RBC: 4.82 Mil/uL (ref 4.22–5.81)
RDW: 12.3 % (ref 11.5–15.5)
WBC: 4.2 10*3/uL (ref 4.0–10.5)

## 2014-02-20 LAB — PSA: PSA: 2.35 ng/mL (ref 0.10–4.00)

## 2014-02-20 LAB — LIPID PANEL
CHOL/HDL RATIO: 5
Cholesterol: 201 mg/dL — ABNORMAL HIGH (ref 0–200)
HDL: 39.4 mg/dL (ref >39.00)
LDL CALC: 142 mg/dL — AB (ref 0–99)
NonHDL: 161.6
Triglycerides: 96 mg/dL (ref 0.0–149.0)
VLDL: 19.2 mg/dL (ref 0.0–40.0)

## 2014-02-24 ENCOUNTER — Encounter: Payer: 59 | Admitting: Internal Medicine

## 2014-04-22 ENCOUNTER — Other Ambulatory Visit (INDEPENDENT_AMBULATORY_CARE_PROVIDER_SITE_OTHER): Payer: Self-pay

## 2014-04-22 DIAGNOSIS — Z0000 Encounter for general adult medical examination without abnormal findings: Secondary | ICD-10-CM

## 2014-04-22 LAB — FECAL OCCULT BLOOD, IMMUNOCHEMICAL: Fecal Occult Bld: NEGATIVE

## 2014-10-06 ENCOUNTER — Ambulatory Visit (HOSPITAL_BASED_OUTPATIENT_CLINIC_OR_DEPARTMENT_OTHER)
Admission: RE | Admit: 2014-10-06 | Discharge: 2014-10-06 | Disposition: A | Discharge status: Home / Self Care | Payer: BLUE CROSS/BLUE SHIELD | Source: Ambulatory Visit | Attending: Family Medicine | Admitting: Family Medicine

## 2014-10-06 ENCOUNTER — Ambulatory Visit (INDEPENDENT_AMBULATORY_CARE_PROVIDER_SITE_OTHER): Payer: BLUE CROSS/BLUE SHIELD | Admitting: Family Medicine

## 2014-10-06 ENCOUNTER — Encounter: Payer: Self-pay | Admitting: Family Medicine

## 2014-10-06 VITALS — BP 138/83 | HR 70 | Ht 71.0 in | Wt 150.0 lb

## 2014-10-06 DIAGNOSIS — S199XXD Unspecified injury of neck, subsequent encounter: Secondary | ICD-10-CM | POA: Diagnosis present

## 2014-10-06 DIAGNOSIS — S199XXA Unspecified injury of neck, initial encounter: Secondary | ICD-10-CM | POA: Diagnosis not present

## 2014-10-06 NOTE — Patient Instructions (Addendum)
You have a cervical strain. Ibuprofen  three times a day OR aleve 2 tabs twice a day with food for pain and inflammation only if needed. Simple range of motion exercises within limits of pain to prevent further stiffness. Consider physical therapy for stretching, exercises, traction, and modalities if not improving over the next 2-3 weeks as expected. Heat 15 minutes at a time 3-4 times a day to help with spasms. Watch head position when on computers, texting, when sleeping in bed - should in line with back to prevent further muscle spasms. If not improving we will consider physical therapy or imaging (x-rays, MRI). Follow up with me in 1 month.
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
## 2014-11-06 ENCOUNTER — Encounter: Payer: Self-pay | Admitting: Family Medicine

## 2014-11-06 ENCOUNTER — Ambulatory Visit (INDEPENDENT_AMBULATORY_CARE_PROVIDER_SITE_OTHER): Payer: BLUE CROSS/BLUE SHIELD | Admitting: Family Medicine

## 2014-11-06 ENCOUNTER — Encounter (INDEPENDENT_AMBULATORY_CARE_PROVIDER_SITE_OTHER): Payer: Self-pay

## 2014-11-06 ENCOUNTER — Ambulatory Visit: Payer: Self-pay | Admitting: Family Medicine

## 2014-11-06 VITALS — BP 110/75 | HR 82 | Ht 71.0 in | Wt 150.0 lb

## 2014-11-06 DIAGNOSIS — S199XXD Unspecified injury of neck, subsequent encounter: Secondary | ICD-10-CM | POA: Diagnosis not present
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
